# Patient Record
Sex: Female | Born: 1945 | Race: Asian | Hispanic: No | Marital: Single | Smoking: Never smoker
Health system: Southern US, Community
[De-identification: ages and names within clinical notes are randomized; demographics above are authoritative.]

## PROBLEM LIST (undated history)

## (undated) DIAGNOSIS — M1990 Unspecified osteoarthritis, unspecified site: Secondary | ICD-10-CM

## (undated) DIAGNOSIS — I509 Heart failure, unspecified: Secondary | ICD-10-CM

---

## 1998-07-07 ENCOUNTER — Emergency Department (HOSPITAL_COMMUNITY): Admission: EM | Admit: 1998-07-07 | Discharge: 1998-07-07 | Payer: Self-pay | Admitting: Emergency Medicine

## 1998-07-07 ENCOUNTER — Encounter: Payer: Self-pay | Admitting: Emergency Medicine

## 2003-10-12 ENCOUNTER — Ambulatory Visit (HOSPITAL_COMMUNITY): Admission: RE | Admit: 2003-10-12 | Discharge: 2003-10-12 | Payer: Self-pay | Admitting: Internal Medicine

## 2004-10-15 ENCOUNTER — Ambulatory Visit (HOSPITAL_COMMUNITY): Admission: RE | Admit: 2004-10-15 | Discharge: 2004-10-15 | Payer: Self-pay | Admitting: Internal Medicine

## 2005-10-20 ENCOUNTER — Ambulatory Visit (HOSPITAL_COMMUNITY): Admission: RE | Admit: 2005-10-20 | Discharge: 2005-10-20 | Payer: Self-pay | Admitting: Internal Medicine

## 2006-10-07 ENCOUNTER — Ambulatory Visit (HOSPITAL_COMMUNITY): Admission: RE | Admit: 2006-10-07 | Discharge: 2006-10-07 | Payer: Self-pay | Admitting: Internal Medicine

## 2007-10-04 ENCOUNTER — Ambulatory Visit (HOSPITAL_COMMUNITY): Admission: RE | Admit: 2007-10-04 | Discharge: 2007-10-04 | Payer: Self-pay | Admitting: Obstetrics & Gynecology

## 2008-11-08 ENCOUNTER — Ambulatory Visit (HOSPITAL_COMMUNITY): Admission: RE | Admit: 2008-11-08 | Discharge: 2008-11-08 | Payer: Self-pay | Admitting: Obstetrics & Gynecology

## 2009-11-13 ENCOUNTER — Ambulatory Visit: Payer: Self-pay | Admitting: Interventional Radiology

## 2009-11-13 ENCOUNTER — Emergency Department (HOSPITAL_BASED_OUTPATIENT_CLINIC_OR_DEPARTMENT_OTHER): Admission: EM | Admit: 2009-11-13 | Discharge: 2009-11-13 | Payer: Self-pay | Admitting: Emergency Medicine

## 2009-12-12 ENCOUNTER — Ambulatory Visit (HOSPITAL_COMMUNITY): Admission: RE | Admit: 2009-12-12 | Discharge: 2009-12-12 | Payer: Self-pay | Admitting: Internal Medicine

## 2010-08-11 ENCOUNTER — Emergency Department (INDEPENDENT_AMBULATORY_CARE_PROVIDER_SITE_OTHER)
Admission: EM | Admit: 2010-08-11 | Discharge: 2010-08-11 | Payer: Self-pay | Source: Home / Self Care | Admitting: Emergency Medicine

## 2010-08-11 DIAGNOSIS — M79609 Pain in unspecified limb: Secondary | ICD-10-CM

## 2010-08-11 DIAGNOSIS — M7989 Other specified soft tissue disorders: Secondary | ICD-10-CM

## 2010-09-30 LAB — RAPID STREP SCREEN (MED CTR MEBANE ONLY): Streptococcus, Group A Screen (Direct): NEGATIVE

## 2013-01-16 ENCOUNTER — Other Ambulatory Visit (HOSPITAL_COMMUNITY): Payer: Self-pay | Admitting: Internal Medicine

## 2013-01-16 DIAGNOSIS — Z1231 Encounter for screening mammogram for malignant neoplasm of breast: Secondary | ICD-10-CM

## 2013-01-23 ENCOUNTER — Ambulatory Visit (HOSPITAL_COMMUNITY)
Admission: RE | Admit: 2013-01-23 | Discharge: 2013-01-23 | Disposition: A | Discharge status: Home / Self Care | Payer: Medicare Other | Source: Ambulatory Visit | Attending: Internal Medicine | Admitting: Internal Medicine

## 2013-01-23 DIAGNOSIS — Z1231 Encounter for screening mammogram for malignant neoplasm of breast: Secondary | ICD-10-CM

## 2014-04-07 IMAGING — MG MM DIGITAL SCREENING BILAT W/ CAD
3 series · 3 of 3 positions shown · non-contrast
Comparison: Previous exam(s).

CLINICAL DATA: Screening.

DIGITAL SCREENING BILATERAL MAMMOGRAM WITH CAD

[R MLO]
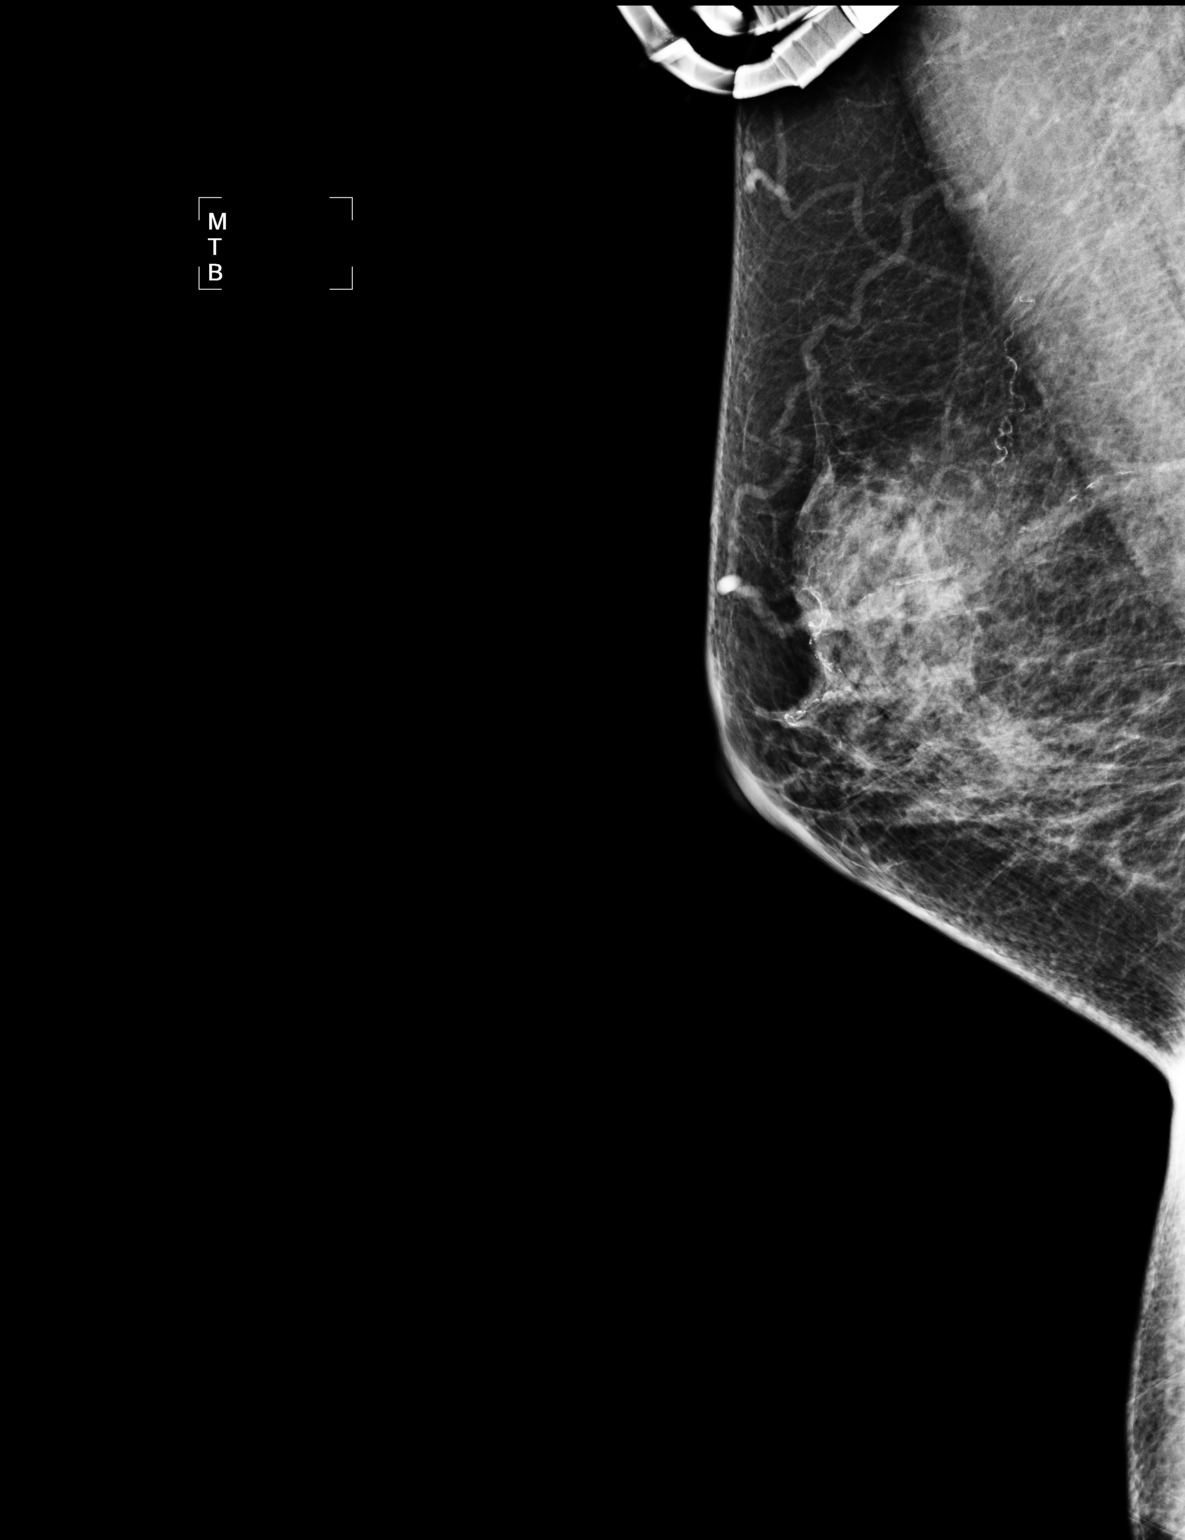

[L CC]
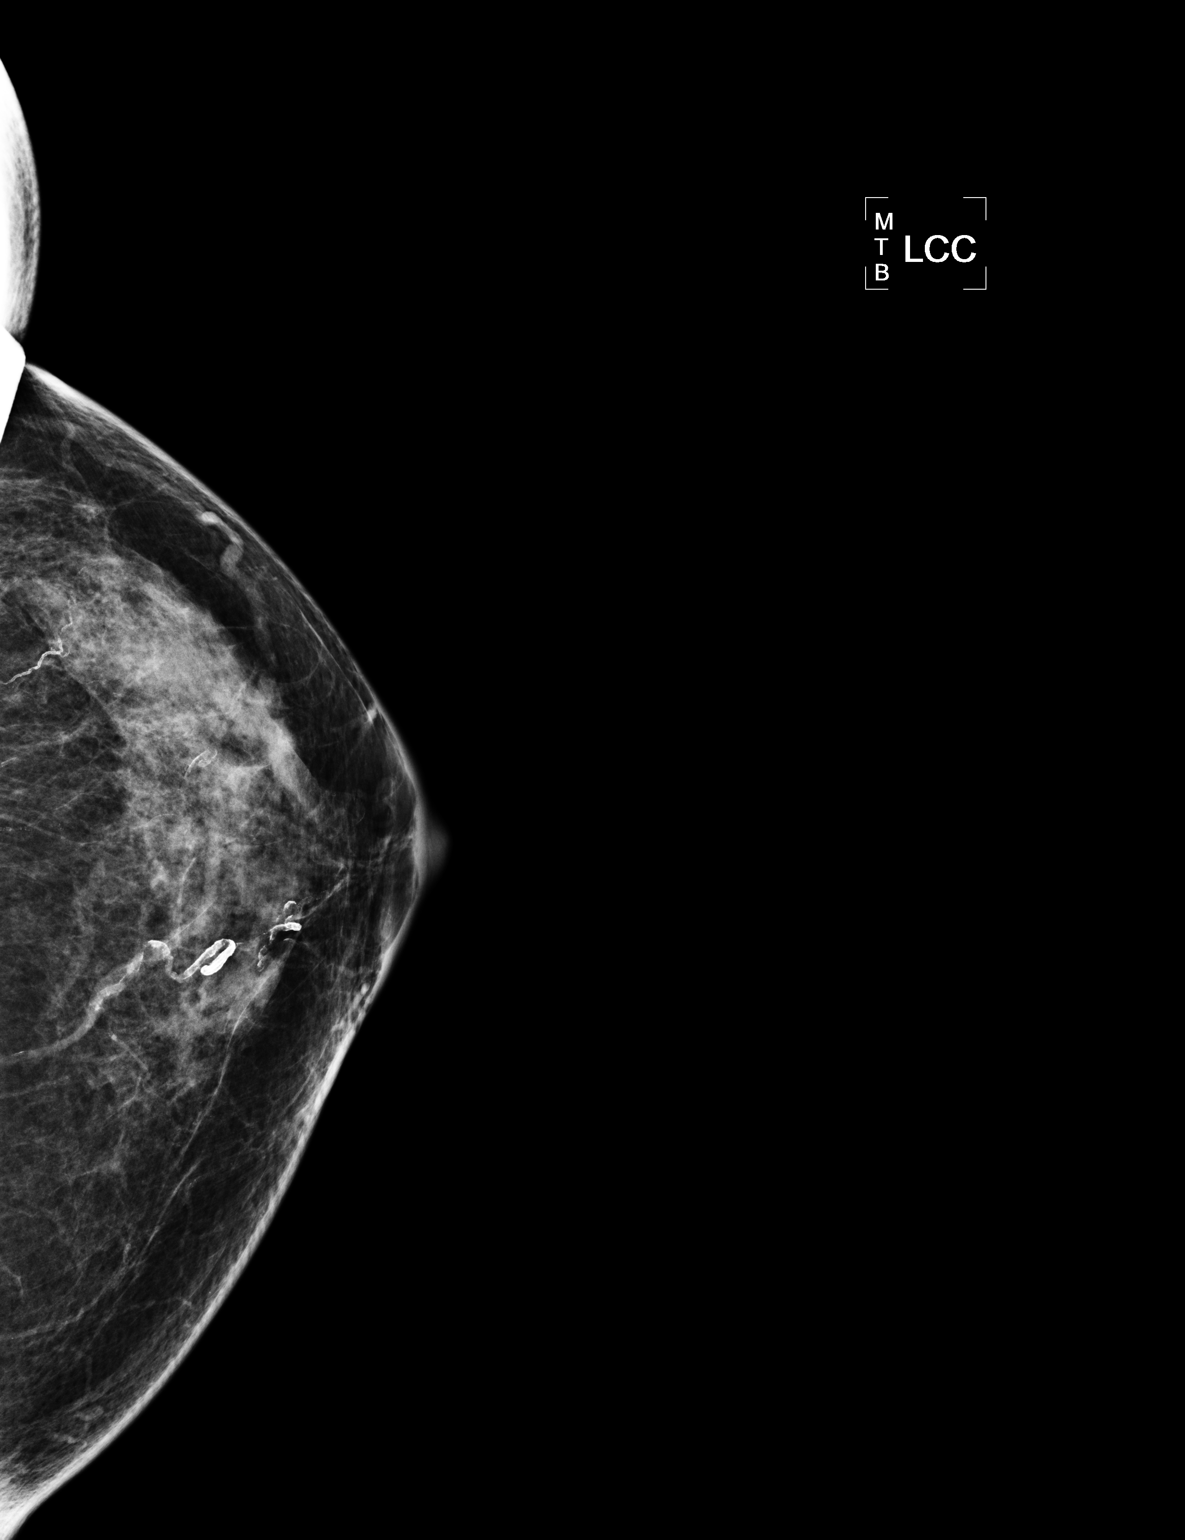

[L MLO]
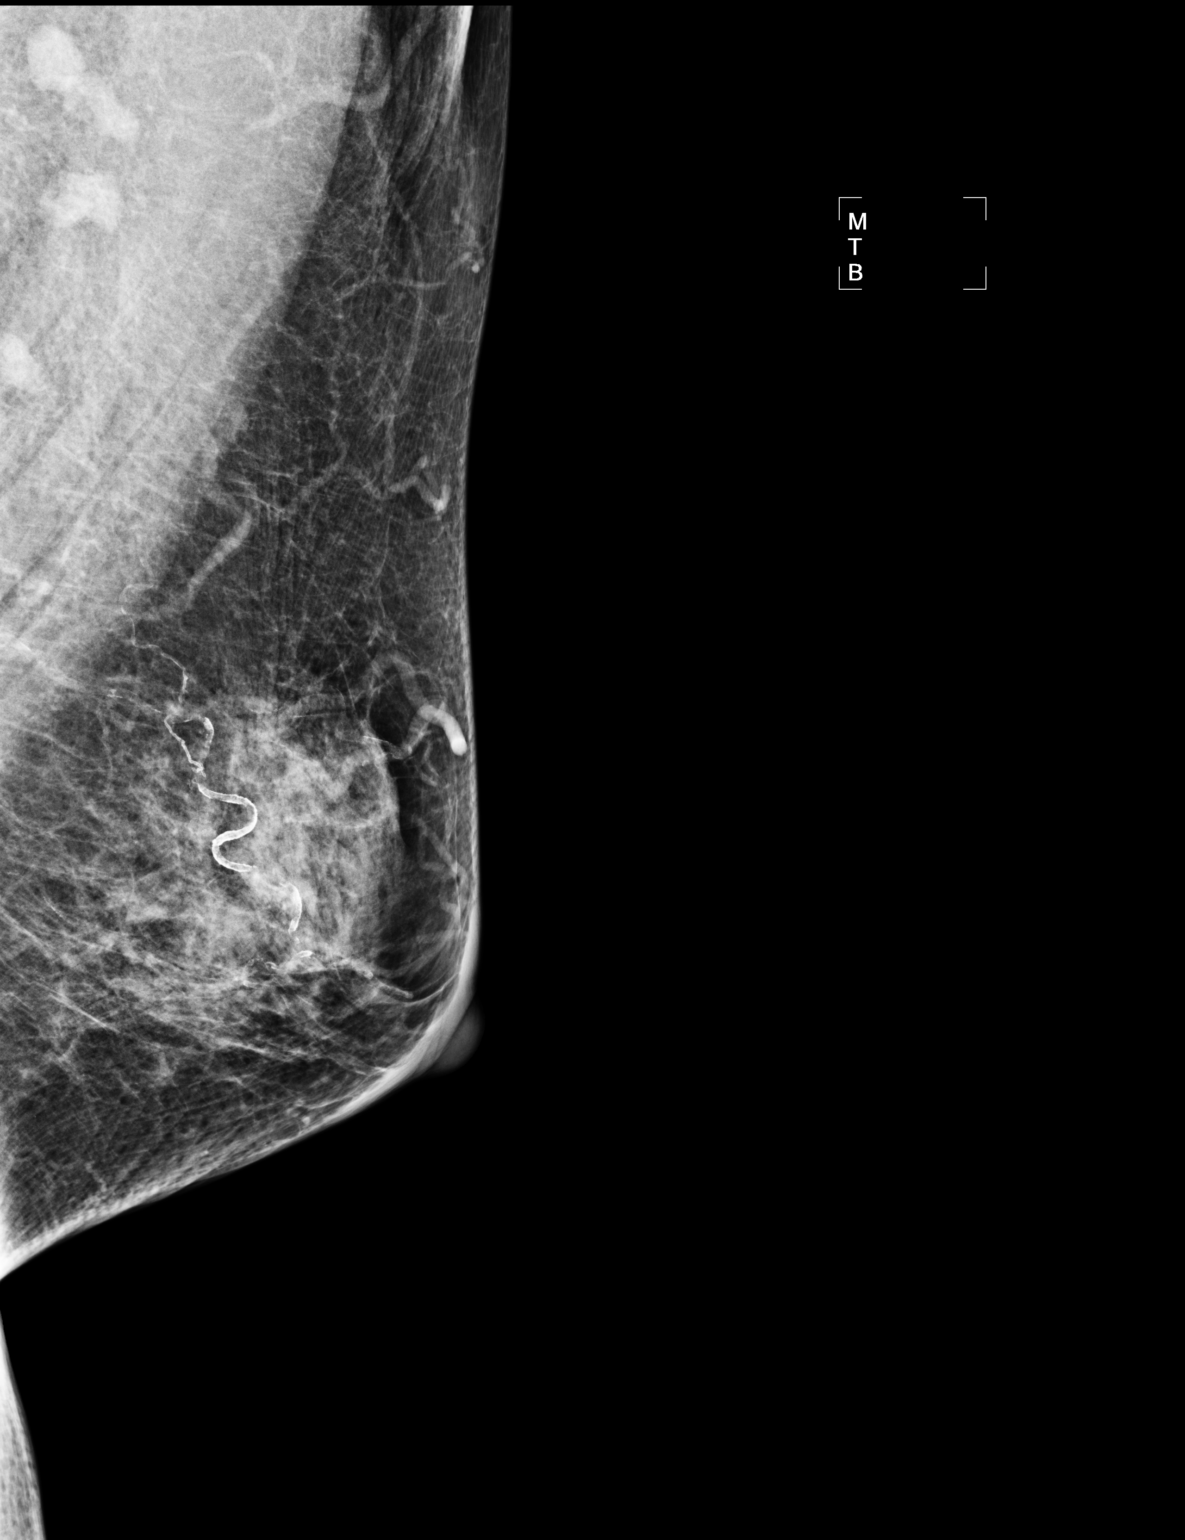

[3 of 3 positions shown; findings below may reference images not displayed]

FINDINGS: ACR Breast Density Category c:  The breast tissue is
heterogeneously dense, which may obscure small masses.

There are no findings suspicious for malignancy.

Images were processed with CAD.
IMPRESSION: No mammographic evidence of malignancy.

A result letter of this screening mammogram will be mailed directly
to the patient.

RECOMMENDATION:
Screening mammogram in one year. (Code:[id number]-[id number])

BI-RADS CATEGORY 1:  Negative.

## 2019-05-09 ENCOUNTER — Emergency Department (HOSPITAL_COMMUNITY)
Admission: EM | Admit: 2019-05-09 | Discharge: 2019-05-10 | Disposition: A | Discharge status: Home / Self Care | Payer: Medicare Other | Attending: Emergency Medicine | Admitting: Emergency Medicine

## 2019-05-09 ENCOUNTER — Emergency Department (HOSPITAL_COMMUNITY): Payer: Medicare Other

## 2019-05-09 ENCOUNTER — Other Ambulatory Visit: Payer: Self-pay

## 2019-05-09 ENCOUNTER — Encounter (HOSPITAL_COMMUNITY): Payer: Self-pay | Admitting: Emergency Medicine

## 2019-05-09 DIAGNOSIS — I5033 Acute on chronic diastolic (congestive) heart failure: Secondary | ICD-10-CM | POA: Diagnosis not present

## 2019-05-09 DIAGNOSIS — J90 Pleural effusion, not elsewhere classified: Secondary | ICD-10-CM | POA: Diagnosis not present

## 2019-05-09 DIAGNOSIS — R0602 Shortness of breath: Secondary | ICD-10-CM | POA: Diagnosis present

## 2019-05-09 DIAGNOSIS — Z940 Kidney transplant status: Secondary | ICD-10-CM | POA: Diagnosis not present

## 2019-05-09 HISTORY — DX: Unspecified osteoarthritis, unspecified site: M19.90

## 2019-05-09 HISTORY — DX: Heart failure, unspecified: I50.9

## 2019-05-09 LAB — CBC WITH DIFFERENTIAL/PLATELET
Abs Immature Granulocytes: 0.01 10*3/uL (ref 0.00–0.07)
Basophils Absolute: 0 10*3/uL (ref 0.0–0.1)
Basophils Relative: 1 %
Eosinophils Absolute: 0.1 10*3/uL (ref 0.0–0.5)
Eosinophils Relative: 2 %
HCT: 45.5 % (ref 36.0–46.0)
Hemoglobin: 14.8 g/dL (ref 12.0–15.0)
Immature Granulocytes: 0 %
Lymphocytes Relative: 14 %
Lymphs Abs: 0.9 10*3/uL (ref 0.7–4.0)
MCH: 32.7 pg (ref 26.0–34.0)
MCHC: 32.5 g/dL (ref 30.0–36.0)
MCV: 100.7 fL — ABNORMAL HIGH (ref 80.0–100.0)
Monocytes Absolute: 0.7 10*3/uL (ref 0.1–1.0)
Monocytes Relative: 11 %
Neutro Abs: 4.7 10*3/uL (ref 1.7–7.7)
Neutrophils Relative %: 72 %
Platelets: 165 10*3/uL (ref 150–400)
RBC: 4.52 MIL/uL (ref 3.87–5.11)
RDW: 14.8 % (ref 11.5–15.5)
WBC: 6.4 10*3/uL (ref 4.0–10.5)
nRBC: 0 % (ref 0.0–0.2)

## 2019-05-09 LAB — BASIC METABOLIC PANEL
Anion gap: 10 (ref 5–15)
BUN: 24 mg/dL — ABNORMAL HIGH (ref 8–23)
CO2: 23 mmol/L (ref 22–32)
Calcium: 9.4 mg/dL (ref 8.9–10.3)
Chloride: 102 mmol/L (ref 98–111)
Creatinine, Ser: 1.05 mg/dL — ABNORMAL HIGH (ref 0.44–1.00)
GFR calc Af Amer: >60 mL/min (ref >60)
GFR calc non Af Amer: 53 mL/min — ABNORMAL LOW (ref >60)
Glucose, Bld: 148 mg/dL — ABNORMAL HIGH (ref 70–99)
Potassium: 5 mmol/L (ref 3.5–5.1)
Sodium: 135 mmol/L (ref 135–145)

## 2019-05-09 LAB — BRAIN NATRIURETIC PEPTIDE: B Natriuretic Peptide: 1286 pg/mL — ABNORMAL HIGH (ref 0.0–100.0)

## 2019-05-09 NOTE — ED Notes (Signed)
Patient called for vitals rechecik with no answer x1

## 2019-05-09 NOTE — ED Triage Notes (Signed)
Patient presents to the ED by EMS. The patient was at PCP office to do routine labs when she felt chest tightness. She refused to go to the ER. Upon standing and walking with EMS she felt mildly short of breath. Vitals remain stable. Hx of CHF/LVH.

## 2019-05-10 ENCOUNTER — Other Ambulatory Visit: Payer: Self-pay

## 2019-05-10 DIAGNOSIS — J90 Pleural effusion, not elsewhere classified: Secondary | ICD-10-CM | POA: Diagnosis not present

## 2019-05-10 LAB — TROPONIN I (HIGH SENSITIVITY)
Troponin I (High Sensitivity): 88 ng/L — ABNORMAL HIGH (ref <18)
Troponin I (High Sensitivity): 66 ng/L — ABNORMAL HIGH (ref <18)

## 2019-05-10 MED ORDER — MYCOPHENOLATE SODIUM 180 MG PO TBEC
180.0000 mg | DELAYED_RELEASE_TABLET | Freq: Once | ORAL | Status: AC
  Administered 2019-05-10: 180 mg via ORAL
  Filled 2019-05-10: qty 1

## 2019-05-10 MED ORDER — FUROSEMIDE 10 MG/ML IJ SOLN
40.0000 mg | Freq: Once | INTRAMUSCULAR | Status: AC
  Administered 2019-05-10: 40 mg via INTRAVENOUS
  Filled 2019-05-10: qty 4

## 2019-05-10 MED ORDER — TACROLIMUS 0.5 MG PO CAPS
0.5000 mg | ORAL_CAPSULE | Freq: Once | ORAL | Status: AC
  Administered 2019-05-10: 0.5 mg via ORAL
  Filled 2019-05-10 (×2): qty 1

## 2019-05-10 NOTE — ED Provider Notes (Signed)
[facility] Provider Note   CSN: [id number] Arrival date & time: 05/09/19  1801     History   Chief Complaint Chief Complaint  Patient presents with  . Chest Pain    HPI Krystal Fowler is a 73 y.o. 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  Patient has been having shortness of breath with exertion for the last several days.  Patient states that she has she has been drinking less water but is still taking the same dose of torsemide.  She denies any leg swelling.  Denies any chest pain. She went to see her doctor for routine visit and was noted to be tachypneic and O2 sat dropped about 91% with exertion so was sent here for possible CHF exacerbation.  Patient states that she is not eating more salt and she is compliant with her diet as well as her medicines.      The history is provided by the patient.    Past Medical History:  Diagnosis Date  . Arthritis   . CHF (congestive heart failure) (HCC)     There are no active problems to display for this patient.    OB History   No obstetric history on file.      Home Medications    Prior to Admission medications   Not on File    Family History No family history on file.  Social History Social History   Tobacco Use  . Smoking status: Never Smoker  . Smokeless tobacco: Never Used  Substance Use Topics  . Alcohol use: Not Currently  . Drug use: Never     Allergies   Tape   Review of Systems Review of Systems  Respiratory: Positive for shortness of breath.   Cardiovascular: Positive for chest pain.  All other systems reviewed and are negative.    Physical Exam Updated Vital Signs BP 114/65   Pulse 67   Temp 97.6 F (36.4 C) (Oral)   Resp 16   SpO2 100%   Physical Exam Vitals signs and nursing note reviewed.  HENT:     Head: Normocephalic.  Eyes:     Pupils: Pupils  are equal, round, and reactive to light.  Neck:     Musculoskeletal: Normal range of motion.  Cardiovascular:     Rate and Rhythm: Normal rate and regular rhythm.     Heart sounds: Normal heart sounds.  Pulmonary:     Comments: Crackles bilateral bases, worse on the right side  Abdominal:     General: Bowel sounds are normal.     Palpations: Abdomen is soft.  Musculoskeletal: Normal range of motion.  Skin:    General: Skin is warm.     Capillary Refill: Capillary refill takes less than 2 seconds.  Neurological:     General: No focal deficit present.     Mental Status: She is alert and oriented to person, place, and time.  Psychiatric:        Mood and Affect: Mood normal.        Behavior: Behavior normal.      ED Treatments / Results  Labs (all labs ordered are listed, but only abnormal results are displayed) Labs Reviewed  CBC WITH DIFFERENTIAL/PLATELET - Abnormal; Notable for the following components:      Result Value   MCV 100.7 (*)    All other components within normal limits  BASIC METABOLIC PANEL - Abnormal; Notable for the following components:   Glucose, Bld 148 (*)    BUN 24 (*)    Creatinine, Ser 1.05 (*)    GFR calc non Af Amer 53 (*)    All other components within normal limits  BRAIN NATRIURETIC PEPTIDE - Abnormal; Notable for the following components:   B Natriuretic Peptide 1,286.0 (*)    All other components within normal limits  TROPONIN I (HIGH SENSITIVITY) - Abnormal; Notable for the following components:   Troponin I (High Sensitivity) 88 (*)    All other components within normal limits  TROPONIN I (HIGH SENSITIVITY)    EKG EKG Interpretation  Date/Time:  Tuesday May 09 2019 18:07:05 EDT Ventricular Rate:  68 PR Interval:  144 QRS Duration: 166 QT Interval:  476 QTC Calculation: 506 R Axis:   -77 Text Interpretation: AV dual-paced rhythm Abnormal ECG No old tracing to compare Confirmed by Delora Fuel ([id number]) on 05/10/2019 12:54:48 AM  Also confirmed by Delora Fuel ([id number]), editor Hattie Perch [phone])  on 05/10/2019 11:01:13 AM   Radiology Dg Chest 2 View  Result Date: 05/09/2019 CLINICAL DATA:  Shortness of breath and chest tightness EXAM: CHEST - 2 VIEW COMPARISON:  August 10, 2018 FINDINGS: There is a moderate pleural effusion on the right with a minimal pleural effusion on the left. There is bibasilar atelectasis. Heart is upper normal in size with pulmonary vascularity normal. Pacemaker leads are attached to the right atrium and right ventricle. There is aortic atherosclerosis. There is a stent in the left axillary region. No pneumothorax. No bone lesions. IMPRESSION: Moderate pleural effusion on the right with rather minimal pleural effusion on the left. Bibasilar atelectasis. Lungs elsewhere are clear. Heart upper normal in size with pacemaker leads attached to right atrium and right ventricle. Aortic Atherosclerosis (ICD10-I70.0). Electronically Signed   By: Lowella Grip III M.D.   On: 05/09/2019 20:24    Procedures Procedures (including critical care time)  Angiocath insertion Performed by: Wandra Arthurs  Consent: Verbal consent obtained. Risks and benefits: risks, benefits and alternatives were discussed Time out: Immediately prior to procedure a "time out" was called to verify the correct patient, procedure, equipment, support staff and site/side marked as required.  Preparation: Patient was prepped and draped in the usual sterile fashion.  Vein Location: R antecube  Ultrasound Guided  Gauge: 22   Normal blood return and flush without difficulty Patient tolerance: Patient tolerated the procedure well with no immediate complications.     Medications Ordered in ED Medications  mycophenolate (MYFORTIC) EC tablet 180 mg (has no administration in time range)  tacrolimus (PROGRAF) capsule 0.5 mg (has no administration in time range)  furosemide (LASIX) injection 40 mg (40 mg Intravenous Given  05/10/19 0956)     Initial Impression / Assessment and Plan / ED Course  I have reviewed the triage vital signs and the nursing notes.  Pertinent labs & imaging results that were available during my care of the patient were reviewed by me and considered in my medical decision making (see chart for details).       ROSSI SILVESTRO is a 73 y.o. female here with SOB. Has hx of CHF and previous renal failure s/p renal transplant. Consider worsening heart failure vs renal failure. Will get labs, Trop, BNP, CXR.   2:55 PM BNP 1200. CXR showed bilateral pleural effusions, worse on the right. Initial trop 88, repeat was 66. I think likely demand ischemia from CHF. Ambulated  with normal pulse O2. Given lasix and went to the bathroom multiple times. Will increase her torsemide to daily from every other day. Stable for dc with close follow up    Final Clinical Impressions(s) / ED Diagnoses   Final diagnoses:  None    ED Discharge Orders    None       Charlynne Pander, MD 05/10/19 1456

## 2019-05-10 NOTE — ED Notes (Signed)
Pt ambulated in hallway, O2 levels staying between 99-100%

## 2019-05-10 NOTE — Discharge Instructions (Addendum)
Increase your torsemide to daily from every other day   See your doctor this week for follow up. You may need echo of your heart   Return to ER if you have worse shortness of breath, chest pain, trouble breathing

## 2020-07-21 IMAGING — DX DG CHEST 2V
2 series · 2 of 2 positions shown · non-contrast
Comparison: August 10, 2018

CLINICAL DATA: Shortness of breath and chest tightness

EXAM:
CHEST - 2 VIEW

[chest lat]
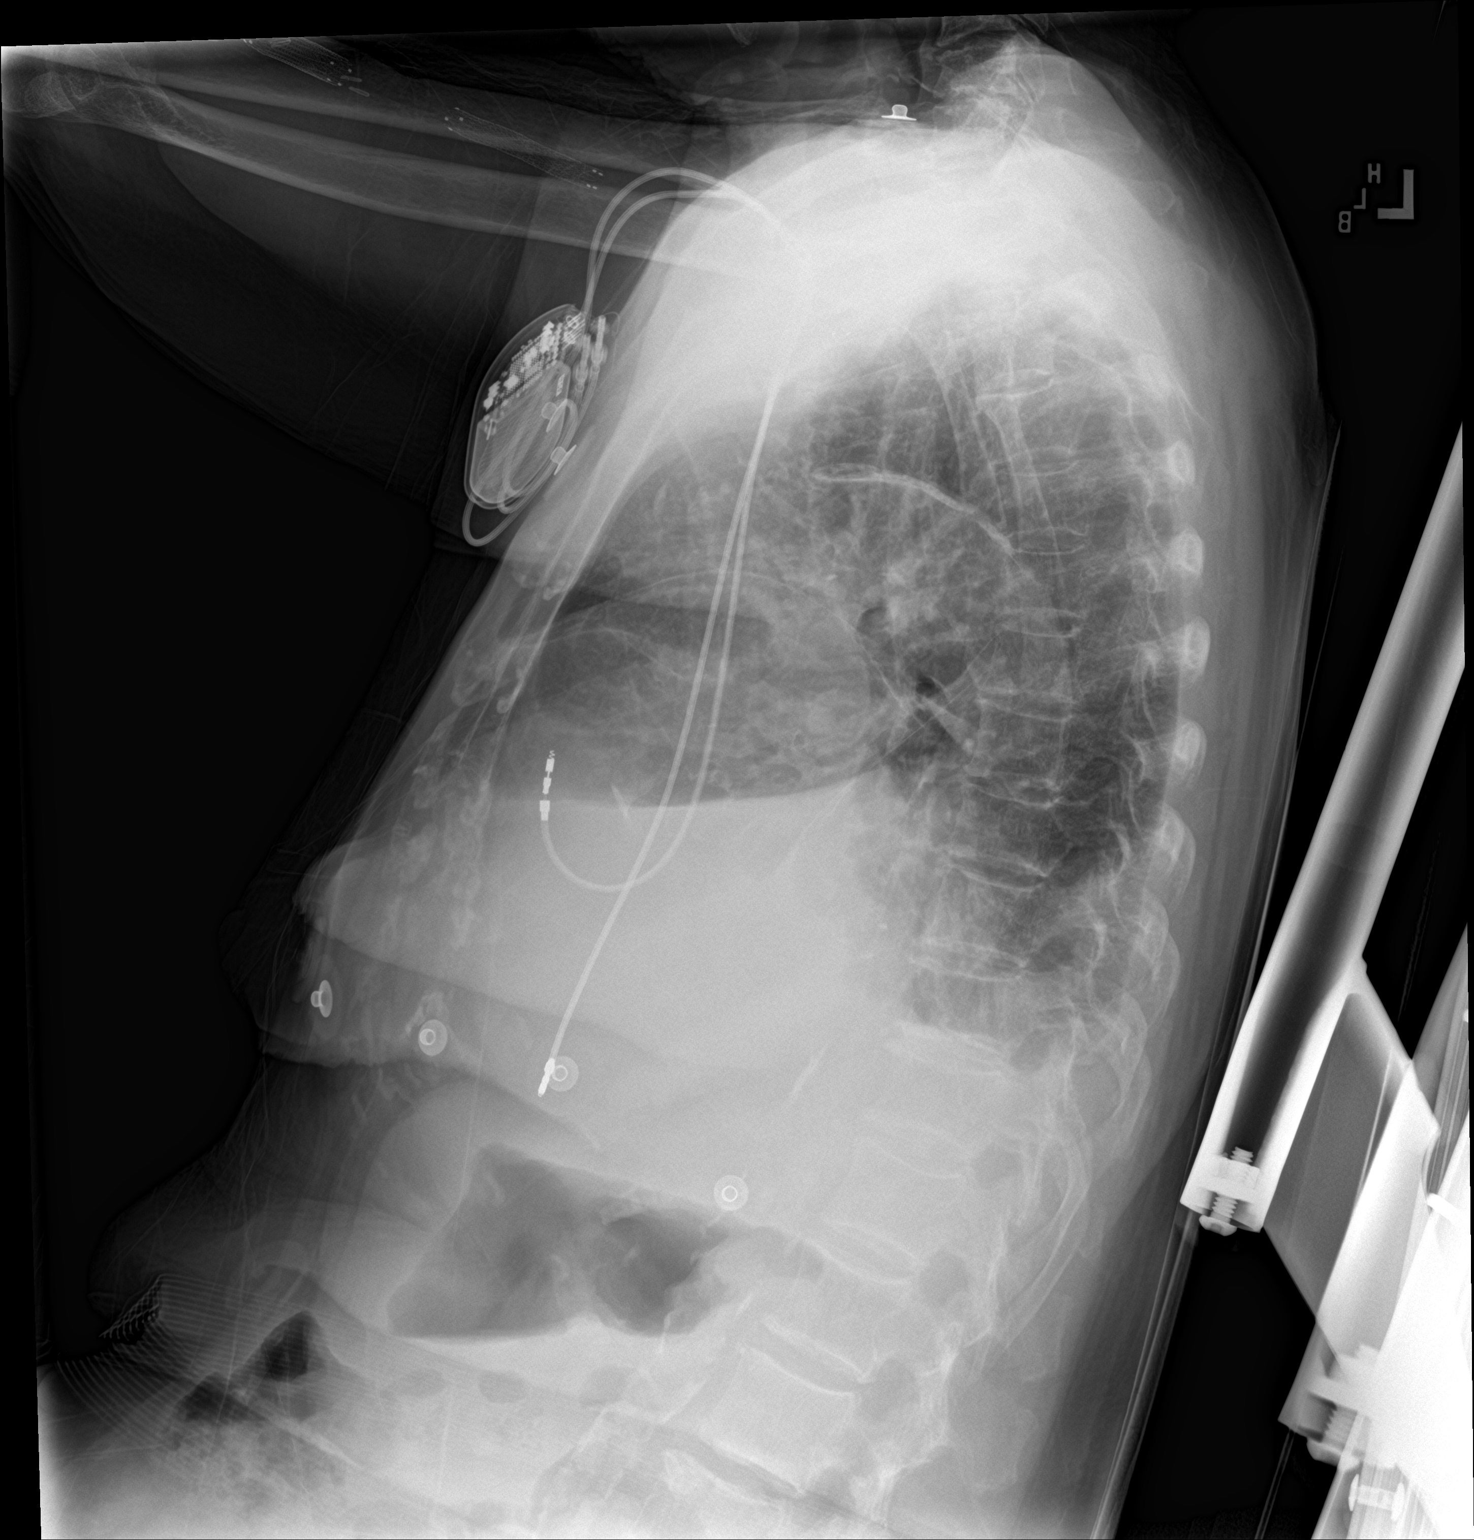

[chest ap]
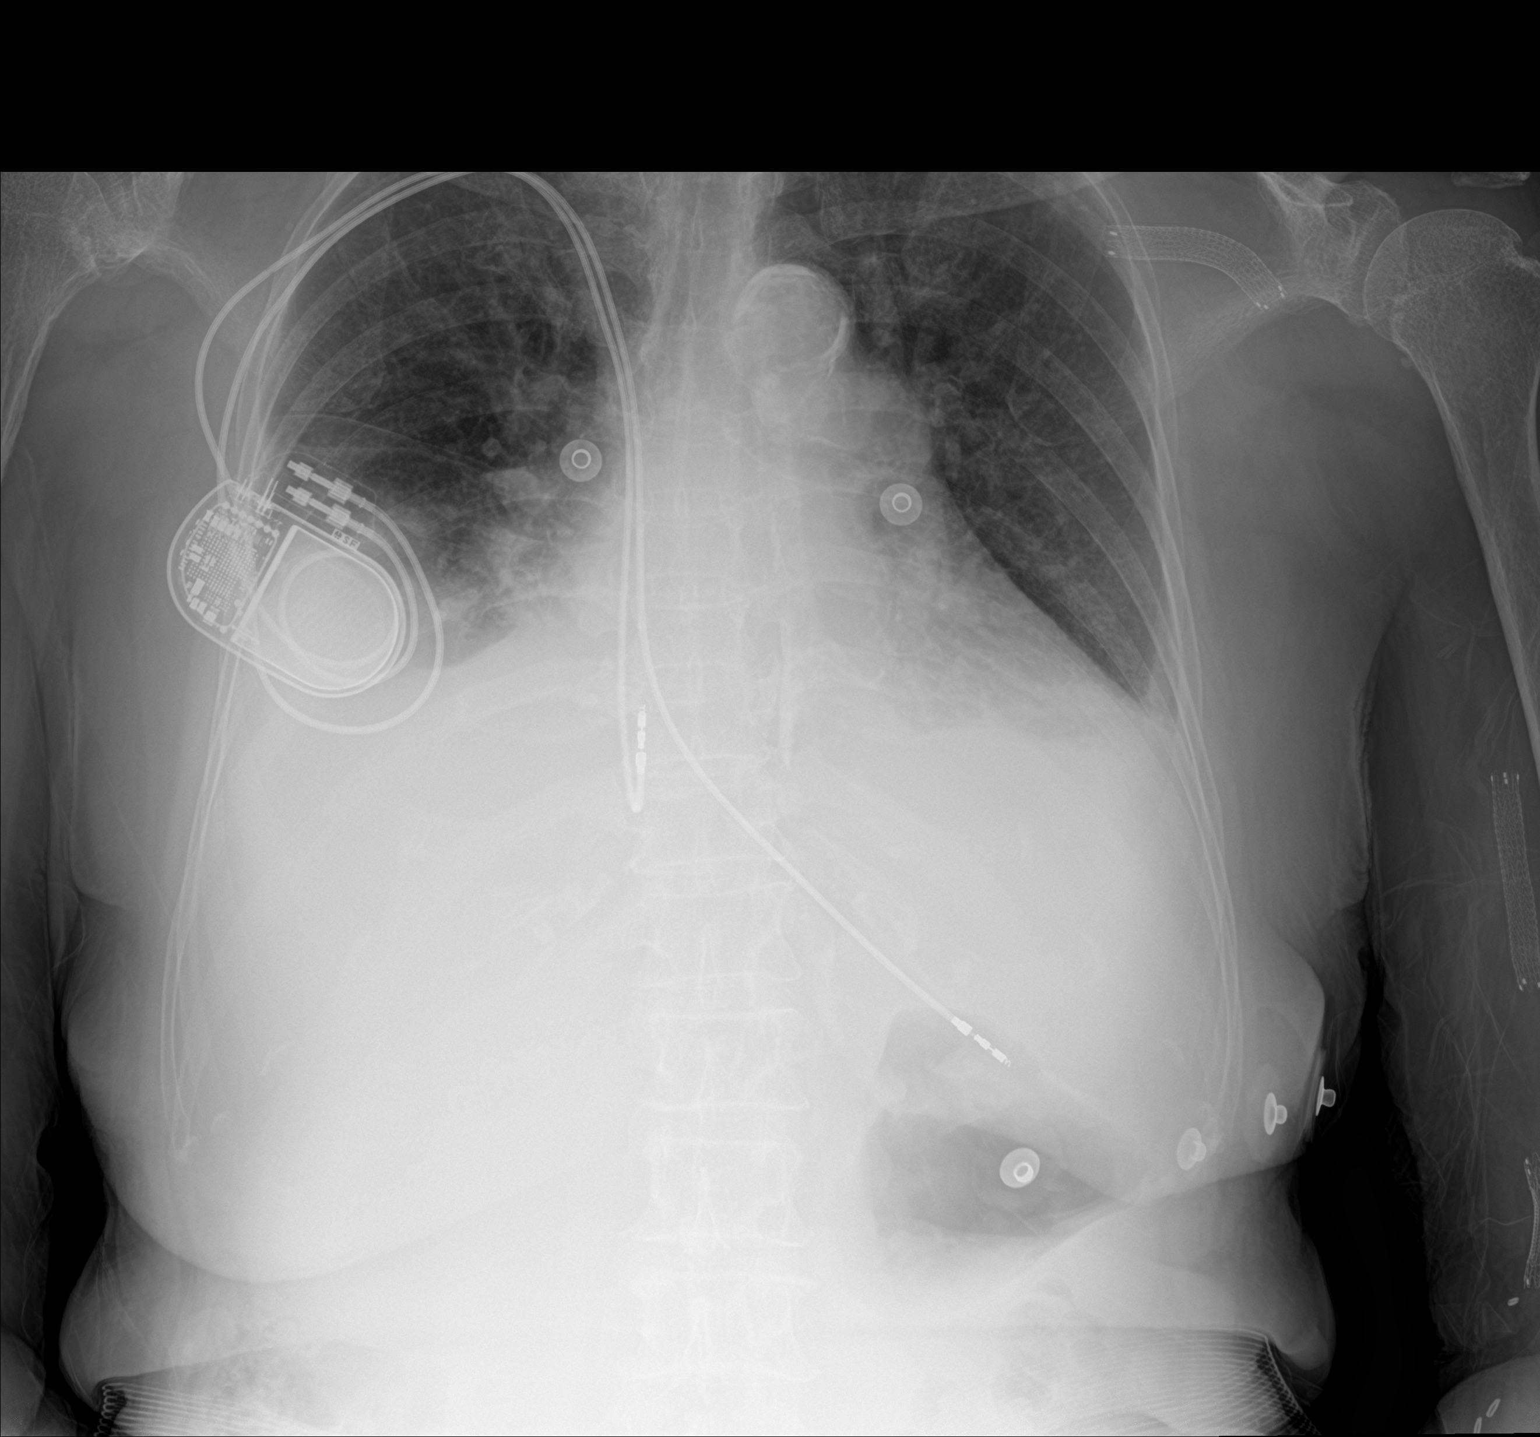

[2 of 2 positions shown; findings below may reference images not displayed]

FINDINGS: There is a moderate pleural effusion on the right with a minimal
pleural effusion on the left. There is bibasilar atelectasis. Heart
is upper normal in size with pulmonary vascularity normal. Pacemaker
leads are attached to the right atrium and right ventricle. There is
aortic atherosclerosis. There is a stent in the left axillary
region. No pneumothorax. No bone lesions.
IMPRESSION: Moderate pleural effusion on the right with rather minimal pleural
effusion on the left. Bibasilar atelectasis. Lungs elsewhere are
clear.

Heart upper normal in size with pacemaker leads attached to right
atrium and right ventricle. Aortic Atherosclerosis ([id number]).
